# Patient Record
Sex: Male | Born: 1975 | Race: Black or African American | Hispanic: No | Marital: Married | Smoking: Current every day smoker
Health system: Southern US, Community
[De-identification: ages and names within clinical notes are randomized; demographics above are authoritative.]

## PROBLEM LIST (undated history)

## (undated) DIAGNOSIS — E7800 Pure hypercholesterolemia, unspecified: Secondary | ICD-10-CM

## (undated) DIAGNOSIS — I639 Cerebral infarction, unspecified: Secondary | ICD-10-CM

## (undated) DIAGNOSIS — K5792 Diverticulitis of intestine, part unspecified, without perforation or abscess without bleeding: Secondary | ICD-10-CM

## (undated) DIAGNOSIS — E119 Type 2 diabetes mellitus without complications: Secondary | ICD-10-CM

## (undated) DIAGNOSIS — I10 Essential (primary) hypertension: Secondary | ICD-10-CM

---

## 2020-07-20 ENCOUNTER — Emergency Department (HOSPITAL_COMMUNITY)
Admission: EM | Admit: 2020-07-20 | Discharge: 2020-07-21 | Disposition: A | Discharge status: Home / Self Care | Payer: Self-pay | Attending: Emergency Medicine | Admitting: Emergency Medicine

## 2020-07-20 ENCOUNTER — Encounter (HOSPITAL_COMMUNITY): Payer: Self-pay | Admitting: Emergency Medicine

## 2020-07-20 ENCOUNTER — Other Ambulatory Visit: Payer: Self-pay

## 2020-07-20 ENCOUNTER — Emergency Department (HOSPITAL_COMMUNITY): Payer: Self-pay

## 2020-07-20 DIAGNOSIS — I10 Essential (primary) hypertension: Secondary | ICD-10-CM | POA: Insufficient documentation

## 2020-07-20 DIAGNOSIS — E1165 Type 2 diabetes mellitus with hyperglycemia: Secondary | ICD-10-CM | POA: Insufficient documentation

## 2020-07-20 DIAGNOSIS — M79651 Pain in right thigh: Secondary | ICD-10-CM | POA: Insufficient documentation

## 2020-07-20 DIAGNOSIS — M62838 Other muscle spasm: Secondary | ICD-10-CM | POA: Insufficient documentation

## 2020-07-20 DIAGNOSIS — R739 Hyperglycemia, unspecified: Secondary | ICD-10-CM

## 2020-07-20 DIAGNOSIS — F17210 Nicotine dependence, cigarettes, uncomplicated: Secondary | ICD-10-CM | POA: Insufficient documentation

## 2020-07-20 HISTORY — DX: Cerebral infarction, unspecified: I63.9

## 2020-07-20 HISTORY — DX: Type 2 diabetes mellitus without complications: E11.9

## 2020-07-20 HISTORY — DX: Diverticulitis of intestine, part unspecified, without perforation or abscess without bleeding: K57.92

## 2020-07-20 HISTORY — DX: Pure hypercholesterolemia, unspecified: E78.00

## 2020-07-20 HISTORY — DX: Essential (primary) hypertension: I10

## 2020-07-20 LAB — CBC
HCT: 39.2 % (ref 39.0–52.0)
Hemoglobin: 13.3 g/dL (ref 13.0–17.0)
MCH: 29.8 pg (ref 26.0–34.0)
MCHC: 33.9 g/dL (ref 30.0–36.0)
MCV: 87.7 fL (ref 80.0–100.0)
Platelets: 334 10*3/uL (ref 150–400)
RBC: 4.47 MIL/uL (ref 4.22–5.81)
RDW: 13.2 % (ref 11.5–15.5)
WBC: 9.5 10*3/uL (ref 4.0–10.5)
nRBC: 0 % (ref 0.0–0.2)

## 2020-07-20 LAB — BASIC METABOLIC PANEL
Anion gap: 14 (ref 5–15)
BUN: 8 mg/dL (ref 6–20)
CO2: 21 mmol/L — ABNORMAL LOW (ref 22–32)
Calcium: 8.7 mg/dL — ABNORMAL LOW (ref 8.9–10.3)
Chloride: 94 mmol/L — ABNORMAL LOW (ref 98–111)
Creatinine, Ser: 0.87 mg/dL (ref 0.61–1.24)
GFR calc Af Amer: >60 mL/min (ref >60)
GFR calc non Af Amer: >60 mL/min (ref >60)
Glucose, Bld: 692 mg/dL (ref 70–99)
Potassium: 3.8 mmol/L (ref 3.5–5.1)
Sodium: 129 mmol/L — ABNORMAL LOW (ref 135–145)

## 2020-07-20 LAB — GLUCOSE, CAPILLARY: Glucose-Capillary: >600 mg/dL (ref 70–99)

## 2020-07-20 LAB — TROPONIN I (HIGH SENSITIVITY): Troponin I (High Sensitivity): 7 ng/L (ref <18)

## 2020-07-20 MED ORDER — SODIUM CHLORIDE 0.9 % IV BOLUS
500.0000 mL | Freq: Once | INTRAVENOUS | Status: AC
  Administered 2020-07-20: 500 mL via INTRAVENOUS

## 2020-07-20 MED ORDER — INSULIN ASPART 100 UNIT/ML IV SOLN
10.0000 [IU] | Freq: Once | INTRAVENOUS | Status: AC
  Administered 2020-07-20: 10 [IU] via INTRAVENOUS
  Filled 2020-07-20: qty 0.1

## 2020-07-20 MED ORDER — DIAZEPAM 5 MG/ML IJ SOLN
5.0000 mg | Freq: Once | INTRAMUSCULAR | Status: AC
  Administered 2020-07-20: 5 mg via INTRAVENOUS
  Filled 2020-07-20: qty 2

## 2020-07-20 NOTE — ED Notes (Signed)
Date and time results received: 07/20/20 10:57 PM  Test: Glucose Critical Value: 692

## 2020-07-20 NOTE — ED Triage Notes (Addendum)
Patient is complaining of right chest pain that is radiating to right upper thigh. Patient states this been going for 8 months but got bad yesterday. Patient CBG reading hi. Patient has a hx of diabetic type II.

## 2020-07-20 NOTE — ED Provider Notes (Signed)
Emergency Department Provider Note   I have reviewed the triage vital signs and the nursing notes.   HISTORY  Chief Complaint Chest Pain and Hyperglycemia   HPI Samuel Doyle is a 44 y.o. male with PMH reviewed below presents to the ED with elevated CBG and chronic right thigh pain.  Patient states that the pain in the right thigh has been going on for many months.  The symptoms radiate from his right chest to the right upper thigh.  No clear modifying factors.  Symptoms are unchanged over the past 8 months with no clear provoking factors.  Patient states that at home his blood sugars have been reading high.  He was recently taken off of his sliding scale insulin due to hypoglycemia.  He has been compliant with his Lantus.  He does not follow regular with a primary care doctor and seems that most of his medication adjustments occur either during admissions to the hospital or in the emergency department. Denies nausea or vomiting.   Past Medical History:  Diagnosis Date   Diabetes mellitus without complication (HCC)    Diverticulitis    High cholesterol    Hypertension    Stroke (HCC)     There are no problems to display for this patient.   History reviewed. No pertinent surgical history.  Allergies Lisinopril  History reviewed. No pertinent family history.  Social History Social History   Tobacco Use   Smoking status: Current Every Day Smoker    Types: Cigarettes   Smokeless tobacco: Never Used  Building services engineer Use: Never used  Substance Use Topics   Alcohol use: Yes   Drug use: Not Currently    Review of Systems  Constitutional: No fever/chills. Positive elevated CBG.  Eyes: No visual changes. ENT: No sore throat. Cardiovascular: Positive chest pain. Respiratory: Denies shortness of breath. Gastrointestinal: No abdominal pain.  No nausea, no vomiting.  No diarrhea.  No constipation. Genitourinary: Negative for  dysuria. Musculoskeletal: Negative for back pain. Positive right thigh pain.  Skin: Negative for rash. Neurological: Negative for headaches, focal weakness or numbness.  10-point ROS otherwise negative.  ____________________________________________   PHYSICAL EXAM:  VITAL SIGNS: ED Triage Vitals  Enc Vitals Group     BP 07/20/20 2201 117/81     Pulse Rate 07/20/20 2201 (!) 111     Resp 07/20/20 2201 (!) 22     Temp 07/20/20 2201 98.6 F (37 C)     Temp Source 07/20/20 2201 Oral     SpO2 07/20/20 2201 100 %     Weight 07/20/20 2148 200 lb (90.7 kg)     Height 07/20/20 2148 6\' 2"  (1.88 m)   Constitutional: Alert and oriented. Well appearing and in no acute distress. Eyes: Conjunctivae are normal. Head: Atraumatic. Nose: No congestion/rhinnorhea. Mouth/Throat: Mucous membranes are moist. Neck: No stridor.   Cardiovascular: Tachycardia. Good peripheral circulation. Grossly normal heart sounds.   Respiratory: Normal respiratory effort.  No retractions. Lungs CTAB. Gastrointestinal: Soft and nontender. No distention.  Musculoskeletal: No lower extremity tenderness nor edema. No gross deformities of extremities. Neurologic:  Normal speech and language. No gross focal neurologic deficits are appreciated.  Skin:  Skin is warm, dry and intact. No rash noted.  ____________________________________________   LABS (all labs ordered are listed, but only abnormal results are displayed)  Labs Reviewed  BASIC METABOLIC PANEL - Abnormal; Notable for the following components:      Result Value   Sodium 129 (*)  Chloride 94 (*)    CO2 21 (*)    Glucose, Bld 692 (*)    Calcium 8.7 (*)    All other components within normal limits  GLUCOSE, CAPILLARY - Abnormal; Notable for the following components:   Glucose-Capillary >600 (*)    All other components within normal limits  CBG MONITORING, ED - Abnormal; Notable for the following components:   Glucose-Capillary 403 (*)    All other  components within normal limits  CBC  CBG MONITORING, ED  TROPONIN I (HIGH SENSITIVITY)  TROPONIN I (HIGH SENSITIVITY)   ____________________________________________  EKG   EKG Interpretation  Date/Time:  Wednesday July 20 2020 22:36:59 EDT Ventricular Rate:  95 PR Interval:    QRS Duration: 102 QT Interval:  388 QTC Calculation: 488 R Axis:   42 Text Interpretation: Sinus rhythm Probable left atrial enlargement LVH with secondary repolarization abnormality Borderline prolonged QT interval No STEMI Confirmed by Alona Bene [phone]) on 07/20/2020 11:32:31 PM       ____________________________________________  RADIOLOGY  DG Chest 2 View  Result Date: 07/20/2020 CLINICAL DATA:  Chest pain EXAM: CHEST - 2 VIEW COMPARISON:  07/19/2020 FINDINGS: The heart size and mediastinal contours are within normal limits. Both lungs are clear. The visualized skeletal structures are unremarkable. IMPRESSION: No active cardiopulmonary disease. Electronically Signed   By: Charlett Nose M.D.   On: 07/20/2020 22:11    ____________________________________________   PROCEDURES  Procedure(s) performed:   Procedures  None  ____________________________________________   INITIAL IMPRESSION / ASSESSMENT AND PLAN / ED COURSE  Pertinent labs & imaging results that were available during my care of the patient were reviewed by me and considered in my medical decision making (see chart for details).   Patient presents to the emergency department very atypical spasm-like pain in the right chest radiating to the right thigh.  Symptoms seem spasm-like in nature.  His symptoms are improved with Valium.  I did discharge the patient home with the very small number of Valium tablets after review of the [place] drug database.  Patient cautioned to not drive while taking this medication and to not take it with alcohol or other pain medicines.  Patient's lab work here shows no evidence of DKA. Patient  plans to restart his sliding scale insulin and discussed with the primary care doctor.  Discussed ED return precautions in detail.  Blood sugar downtrending here in the emergency department with IV fluids and additional insulin.   ____________________________________________  FINAL CLINICAL IMPRESSION(S) / ED DIAGNOSES  Final diagnoses:  Hyperglycemia  Muscle spasm     MEDICATIONS GIVEN DURING THIS VISIT:  Medications  sodium chloride 0.9 % bolus 500 mL (0 mLs Intravenous Stopped 07/21/20 0054)  insulin aspart (novoLOG) injection 10 Units (10 Units Intravenous Given 07/20/20 2348)  diazepam (VALIUM) injection 5 mg (5 mg Intravenous Given 07/20/20 2346)     NEW OUTPATIENT MEDICATIONS STARTED DURING THIS VISIT:  Discharge Medication List as of 07/21/2020  1:12 AM    START taking these medications   Details  diazepam (VALIUM) 5 MG tablet Take 1 tablet (5 mg total) by mouth every 8 (eight) hours as needed for muscle spasms., Starting Thu 07/21/2020, Normal        Note:  This document was prepared using Dragon voice recognition software and may include unintentional dictation errors.  Alona Bene, MD, [facility] Emergency Medicine    Cherine Drumgoole, Arlyss Repress, MD 07/22/20 [phone]

## 2020-07-21 LAB — TROPONIN I (HIGH SENSITIVITY): Troponin I (High Sensitivity): 9 ng/L (ref <18)

## 2020-07-21 LAB — CBG MONITORING, ED: Glucose-Capillary: 403 mg/dL — ABNORMAL HIGH (ref 70–99)

## 2020-07-21 MED ORDER — DIAZEPAM 5 MG PO TABS
5.0000 mg | ORAL_TABLET | Freq: Three times a day (TID) | ORAL | 0 refills | Status: AC | PRN
Start: 2020-07-21 — End: ?

## 2020-07-21 NOTE — Discharge Instructions (Signed)
You were seen in the emergency department today with muscle spasms and elevated blood sugars.  Please discuss with your PCP regarding restarting your sliding scale insulin.  Please call the office first thing tomorrow to schedule your follow-up appointment.  I have discharged home with a very small number of Valium for muscle relaxation.  You cannot take this medicine with other pain medicines or alcohol.  He cannot drive a car while taking this medicine as it can impair your ability to do so.  Return to the emergency department any new or suddenly worsening symptoms.

## 2020-07-24 ENCOUNTER — Other Ambulatory Visit: Payer: Self-pay

## 2020-07-24 ENCOUNTER — Emergency Department (HOSPITAL_COMMUNITY)
Admission: EM | Admit: 2020-07-24 | Discharge: 2020-07-24 | Disposition: A | Discharge status: Home / Self Care | Payer: Self-pay | Attending: Emergency Medicine | Admitting: Emergency Medicine

## 2020-07-24 ENCOUNTER — Emergency Department (HOSPITAL_COMMUNITY): Payer: Self-pay

## 2020-07-24 DIAGNOSIS — F17210 Nicotine dependence, cigarettes, uncomplicated: Secondary | ICD-10-CM | POA: Insufficient documentation

## 2020-07-24 DIAGNOSIS — Z79899 Other long term (current) drug therapy: Secondary | ICD-10-CM | POA: Insufficient documentation

## 2020-07-24 DIAGNOSIS — R079 Chest pain, unspecified: Secondary | ICD-10-CM | POA: Insufficient documentation

## 2020-07-24 DIAGNOSIS — I10 Essential (primary) hypertension: Secondary | ICD-10-CM | POA: Insufficient documentation

## 2020-07-24 DIAGNOSIS — Z7984 Long term (current) use of oral hypoglycemic drugs: Secondary | ICD-10-CM | POA: Insufficient documentation

## 2020-07-24 DIAGNOSIS — Z7982 Long term (current) use of aspirin: Secondary | ICD-10-CM | POA: Insufficient documentation

## 2020-07-24 DIAGNOSIS — R519 Headache, unspecified: Secondary | ICD-10-CM | POA: Insufficient documentation

## 2020-07-24 DIAGNOSIS — E1165 Type 2 diabetes mellitus with hyperglycemia: Secondary | ICD-10-CM | POA: Insufficient documentation

## 2020-07-24 DIAGNOSIS — K6289 Other specified diseases of anus and rectum: Secondary | ICD-10-CM | POA: Insufficient documentation

## 2020-07-24 LAB — CBC
HCT: 39.9 % (ref 39.0–52.0)
Hemoglobin: 13.7 g/dL (ref 13.0–17.0)
MCH: 30.1 pg (ref 26.0–34.0)
MCHC: 34.3 g/dL (ref 30.0–36.0)
MCV: 87.7 fL (ref 80.0–100.0)
Platelets: 287 10*3/uL (ref 150–400)
RBC: 4.55 MIL/uL (ref 4.22–5.81)
RDW: 13.3 % (ref 11.5–15.5)
WBC: 10.4 10*3/uL (ref 4.0–10.5)
nRBC: 0 % (ref 0.0–0.2)

## 2020-07-24 LAB — BASIC METABOLIC PANEL
Anion gap: 11 (ref 5–15)
BUN: 9 mg/dL (ref 6–20)
CO2: 24 mmol/L (ref 22–32)
Calcium: 9 mg/dL (ref 8.9–10.3)
Chloride: 97 mmol/L — ABNORMAL LOW (ref 98–111)
Creatinine, Ser: 0.73 mg/dL (ref 0.61–1.24)
GFR calc Af Amer: >60 mL/min (ref >60)
GFR calc non Af Amer: >60 mL/min (ref >60)
Glucose, Bld: 699 mg/dL (ref 70–99)
Potassium: 4.4 mmol/L (ref 3.5–5.1)
Sodium: 132 mmol/L — ABNORMAL LOW (ref 135–145)

## 2020-07-24 LAB — CBG MONITORING, ED: Glucose-Capillary: >600 mg/dL (ref 70–99)

## 2020-07-24 LAB — TROPONIN I (HIGH SENSITIVITY)
Troponin I (High Sensitivity): 7 ng/L (ref <18)
Troponin I (High Sensitivity): 6 ng/L (ref <18)

## 2020-07-24 NOTE — ED Notes (Signed)
Patient has a blue top in the main lab °

## 2020-07-24 NOTE — ED Notes (Signed)
Pfeiffer MD at bedside 

## 2020-07-24 NOTE — ED Provider Notes (Signed)
[facility]-EMERGENCY DEPT Provider Note   CSN: [id number] Arrival date & time: 07/24/20  2423     History Chief Complaint  Patient presents with  . Chest Pain  . rectal spasm    Samuel Doyle is a 44 y.o. male.  HPI Patient has multiple complaints.  He reports he has chest pain that has been ongoing since January.  He reports he persistently has intermittent aching in his chest.  Not particularly worse with exertion.  Comes and goes and nothing makes it better.  No shortness of breath fever or productive cough.  Patient cardiac catheter 6\21\2021. Patient reports he also has problems with rectal pain.  He reports he gets spasms of pain.  It comes every few minutes.  He reports he has been given suppositories in the past but it does not help.  He reports sometimes is hard to have a bowel movement and then he will have hemorrhoids that also interfere.  Nothing he has been given or treated with is ever helped.  He states this is been ongoing since he had a colonoscopy in January.  He reports he had a second colonoscopy to try to solve the problem but it never got better.  He reports he is scheduled to get another colonoscopy in the next several months. Patient has been having headaches regularly for over 5 months.  No confusion, focal weakness numbness or incoordination.  Headaches aching and generalized in quality. When asked about his blood sugars, patient reports that he is typically running in the 200-300 range at baseline.  He reports he cannot afford his Lantus but he does take his sliding scale insulin.    Past Medical History:  Diagnosis Date  . Diabetes mellitus without complication (HCC)   . Diverticulitis   . High cholesterol   . Hypertension   . Stroke [facility])     There are no problems to display for this patient.   No past surgical history on file.     No family history on file.  Social History   Tobacco Use  . Smoking status:  Current Every Day Smoker    Types: Cigarettes  . Smokeless tobacco: Never Used  Vaping Use  . Vaping Use: Never used  Substance Use Topics  . Alcohol use: Yes  . Drug use: Not Currently    Home Medications Prior to Admission medications   Medication Sig Start Date End Date Taking? Authorizing Provider  aspirin 325 MG EC tablet Take 325 mg by mouth daily. 04/06/20   [provider]  atorvastatin (LIPITOR) 10 MG tablet Take 10 mg by mouth daily. 04/06/20   [provider]  diazepam (VALIUM) 5 MG tablet Take 1 tablet (5 mg total) by mouth every 8 (eight) hours as needed for muscle spasms. 07/21/20   Long, Arlyss Repress, MD  insulin glargine (LANTUS) 100 UNIT/ML injection Inject 80 Units into the skin daily.    [provider]  insulin regular (NOVOLIN R) 100 units/mL injection Inject 0-10 Units into the skin 3 (three) times daily before meals. Sliding scale    [provider]  metoprolol tartrate (LOPRESSOR) 25 MG tablet Take 25 mg by mouth 2 (two) times daily.  04/06/20   [provider]    Allergies    Lisinopril  Review of Systems   Review of Systems 10 systems reviewed and negative except as per HPI Physical Exam Updated Vital Signs BP (!) 126/101   Pulse 89   Temp  98.3 F (36.8 C) (Oral)   Resp 17   Ht 6\' 1"  (1.854 m)   Wt 90.7 kg   SpO2 99%   BMI 26.39 kg/m   Physical Exam Constitutional:      Comments: Patient is well-nourished well-developed.  He is clinically well in appearance.  Ambulatory without difficulty.  No appearance of acute distress.  HENT:     Head: Normocephalic and atraumatic.  Eyes:     Extraocular Movements: Extraocular movements intact.     Conjunctiva/sclera: Conjunctivae normal.  Cardiovascular:     Rate and Rhythm: Normal rate and regular rhythm.  Pulmonary:     Effort: Pulmonary effort is normal.     Breath sounds: Normal breath sounds.  Abdominal:     General: There is no distension.     Palpations:  Abdomen is soft.     Tenderness: There is no abdominal tenderness. There is no guarding.  Genitourinary:    Comments: Patient refuses rectal exam Musculoskeletal:        General: No swelling or tenderness. Normal range of motion.     Cervical back: Neck supple.     Right lower leg: No edema.     Left lower leg: No edema.  Skin:    General: Skin is warm and dry.  Neurological:     General: No focal deficit present.     Mental Status: He is oriented to person, place, and time.     Cranial Nerves: No cranial nerve deficit.     Coordination: Coordination normal.     Gait: Gait normal.     ED Results / Procedures / Treatments   Labs (all labs ordered are listed, but only abnormal results are displayed) Labs Reviewed  BASIC METABOLIC PANEL - Abnormal; Notable for the following components:      Result Value   Sodium 132 (*)    Chloride 97 (*)    Glucose, Bld 699 (*)    All other components within normal limits  CBG MONITORING, ED - Abnormal; Notable for the following components:   Glucose-Capillary >600 (*)    All other components within normal limits  CBC  TROPONIN I (HIGH SENSITIVITY)  TROPONIN I (HIGH SENSITIVITY)    EKG EKG Interpretation  Date/Time:  Sunday July 24 2020 09:10:05 EDT Ventricular Rate:  105 PR Interval:    QRS Duration: 97 QT Interval:  339 QTC Calculation: 448 R Axis:   21 Text Interpretation: Sinus tachycardia Multiple ventricular premature complexes Left ventricular hypertrophy no interval change from previous except PVC Confirmed by [id number] [phone]) on 07/24/2020 2:24:18 PM   Radiology DG Chest 2 View  Result Date: 07/24/2020 CLINICAL DATA:  Shortness of breath and chest pain.  Headaches. EXAM: CHEST - 2 VIEW COMPARISON:  06/12/2020, 07/20/2020 as well as chest CT 05/30/2020 and 07/11/2020 FINDINGS: Lungs are adequately inflated without focal lobar consolidation or effusion. Focal nodular opacity over the right midlung slightly less  prominent compared to the chest x-ray 06/12/2020 and noted to be slightly smaller on most recent CT compared to 05/30/2020. This likely resents resolving infectious/inflammatory process. Cardiomediastinal silhouette and remainder the exam is unchanged. IMPRESSION: 1.  No acute cardiopulmonary disease. 2. Focal nodular density over the right midlung with slight interval improvement. Likely resolving infectious/inflammatory process. Recommend follow-up chest radiograph 6-8 weeks. Electronically Signed   By: 06/01/2020 M.D.   On: 07/24/2020 11:04    Procedures Procedures (including critical care time)  Medications Ordered in ED Medications - No data  to display  ED Course  I have reviewed the triage vital signs and the nursing notes.  Pertinent labs & imaging results that were available during my care of the patient were reviewed by me and considered in my medical decision making (see chart for details).    MDM Rules/Calculators/A&P                         Patient presents as outlined above with multiple complaints.  He reports chest pain.  This appears to be quite longstanding and unlikely to be unstable angina.  Troponins are negative.  EKG does not show acute ischemic pattern present patient had cardiac catheterization in June with recommendations for medical management.  No acute recommendations regarding the patient's chest pain at this time. Patient reports rectal pain.  This also has been fairly longstanding.  Patient has had colonoscopies and is working with gastroenterology.  He describes spasms.  Patient had CT of the abdomen pelvis in July.  Abdomen is soft do not think that repeat imaging is indicated.  Patient refused to allow rectal exam at this time.  I do not have specific recommendations for management without performing rectal exam.  Patient does have follow-up with gastroenterology.  This is a likely stable condition but cannot definitively say there is no proctitis or other  significant finding.  Patient has significant hyperglycemia.  He does not show signs of being confused or encephalopathic.  He is not vomiting.  Patient is up and ambulatory and clinically well in appearance.  No acidosis or anion gap.  At this time consistent with poorly managed hyperglycemia.  Patient states he cannot afford Lantus and that he is using his sliding scale insulin.  Patient refused additional management of his hyperglycemia in the emergency department.  Patient has had significant medical care in terms of colonoscopies, catheterizations and hospitalizations.  It seems likely patient has had referrals to social work or other support for getting affordable medical care but I will add social work consult to try to coordinate affordable medications for patient. Patient reports he refuses any further evaluation or treatment.  He wants his IV out and he wants to leave.  He states "I know you are not going to do anything for me anyways".  Patient is being signed out AMA for refusal to allow for further management of hyperglycemia and rectal exam to definitively clear from perspective of possible rectal complications. Final Clinical Impression(s) / ED Diagnoses Final diagnoses:  Rectal pain  Uncontrolled type 2 diabetes mellitus with hyperglycemia (HCC)    Rx / DC Orders ED Discharge Orders    None       Arby Barrette, MD 07/24/20 1437

## 2020-07-24 NOTE — Discharge Instructions (Addendum)
1.  See your family doctor as soon as possible to discuss financial inability to afford your insulin.  Consultations been placed to the case manager at the hospital to see if you could be assisted to get your medications regularly. 2.  Follow-up with your gastroenterologist to continue working on management of rectal pain. 3.  Return to the emergency department if you determine you want ongoing care.

## 2020-07-24 NOTE — ED Notes (Signed)
Pt verbalized wish to leave. This RN made Pfeiffer Md aware.

## 2020-07-24 NOTE — ED Notes (Signed)
Date and time results received: 07/24/20 11:55 AM   Test: Glucose  Critical Value: 699  Name of Provider Notified: Triage   Orders Received? Or Actions Taken?:

## 2020-07-24 NOTE — ED Notes (Signed)
Pt left AMA. Unable to sign signature pad. IV removed.

## 2020-07-24 NOTE — ED Triage Notes (Signed)
Patient reports chest pain, rectal spasms and headaches starting 5 months ago. Patient states the pains come and go all day long. Chest pain 7/10, rectal spasm 10/10

## 2022-03-12 IMAGING — CR DG CHEST 2V
2 series · 2 of 2 positions shown · non-contrast
Comparison: 07/19/2020

CLINICAL DATA: Chest pain

EXAM:
CHEST - 2 VIEW

[w chest pa]
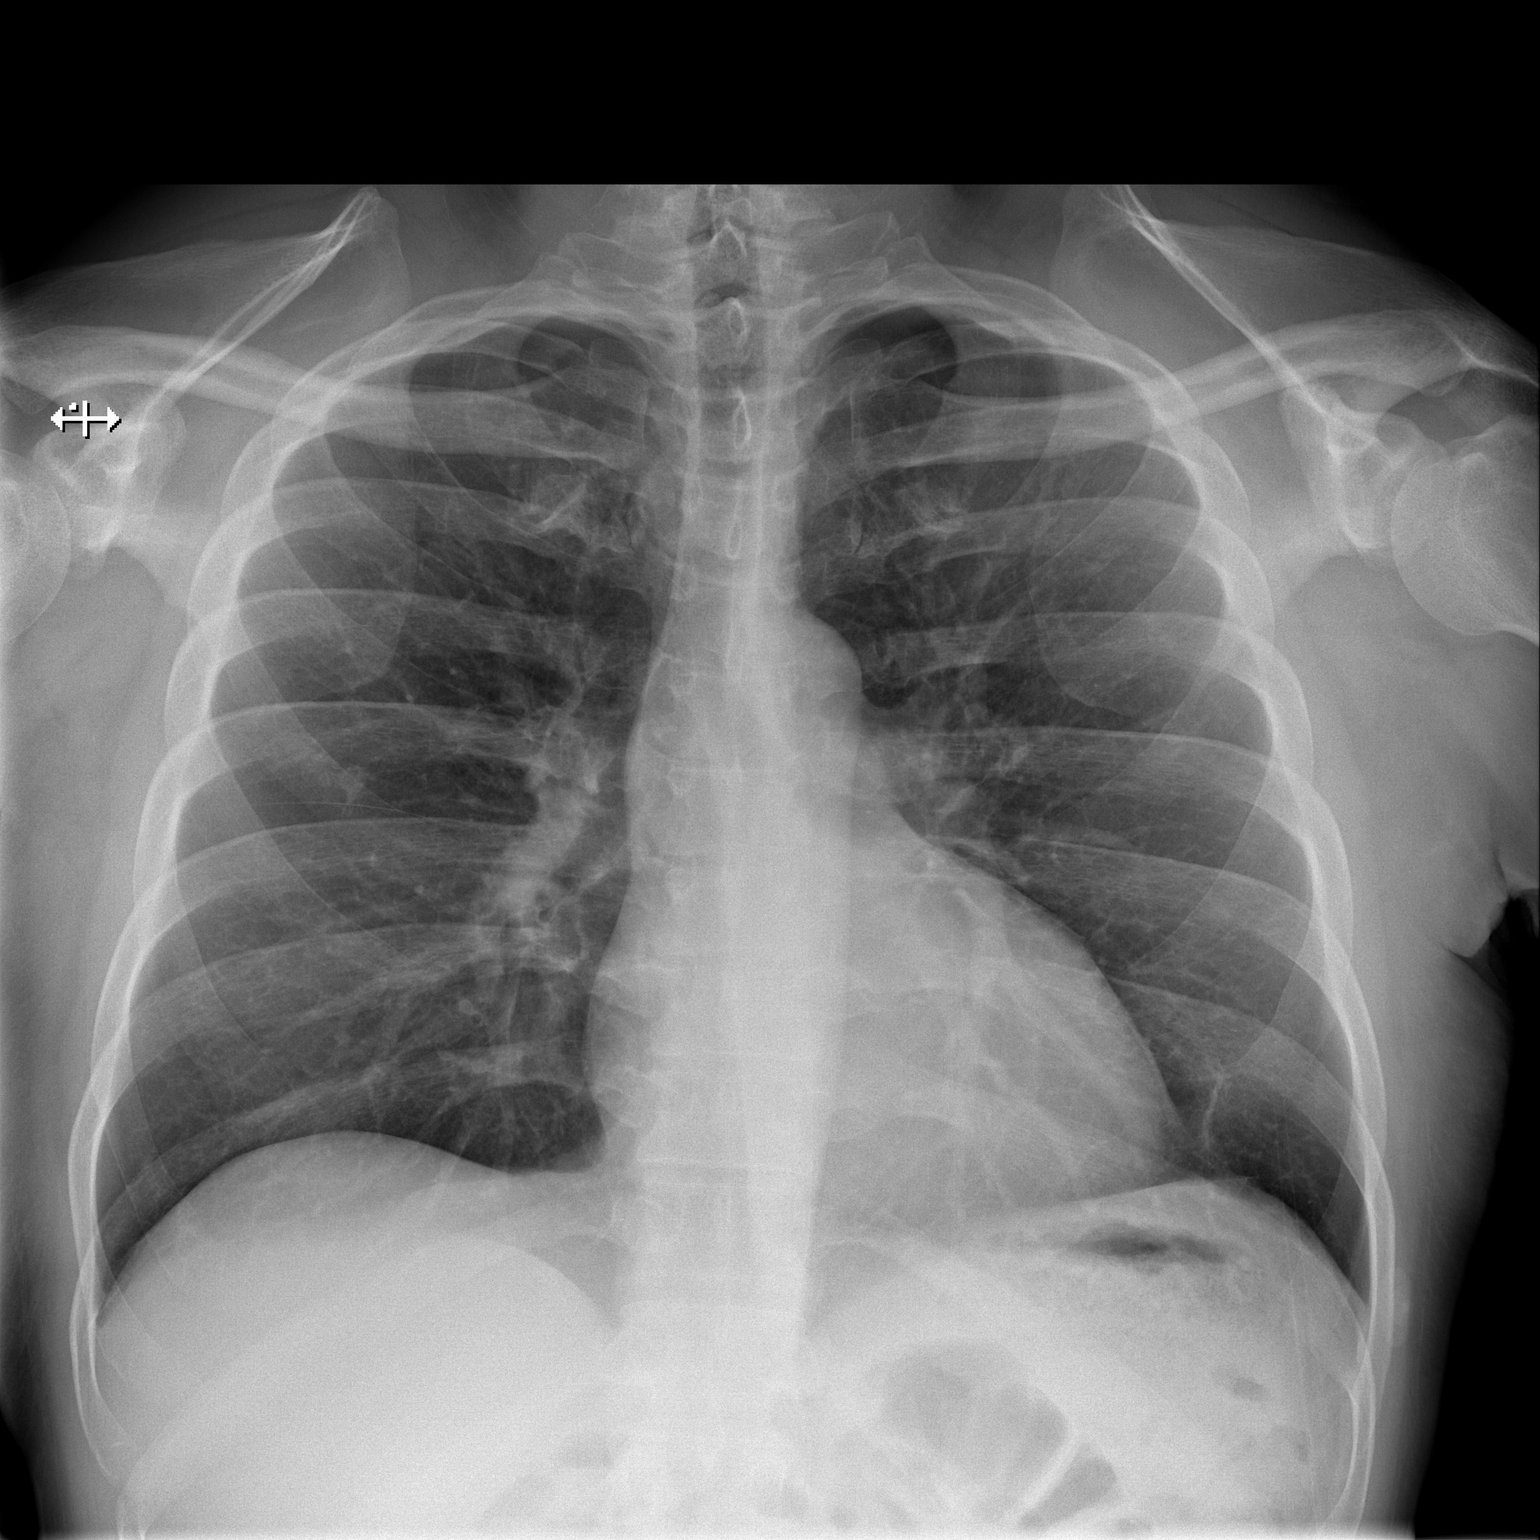

[w chest lat]
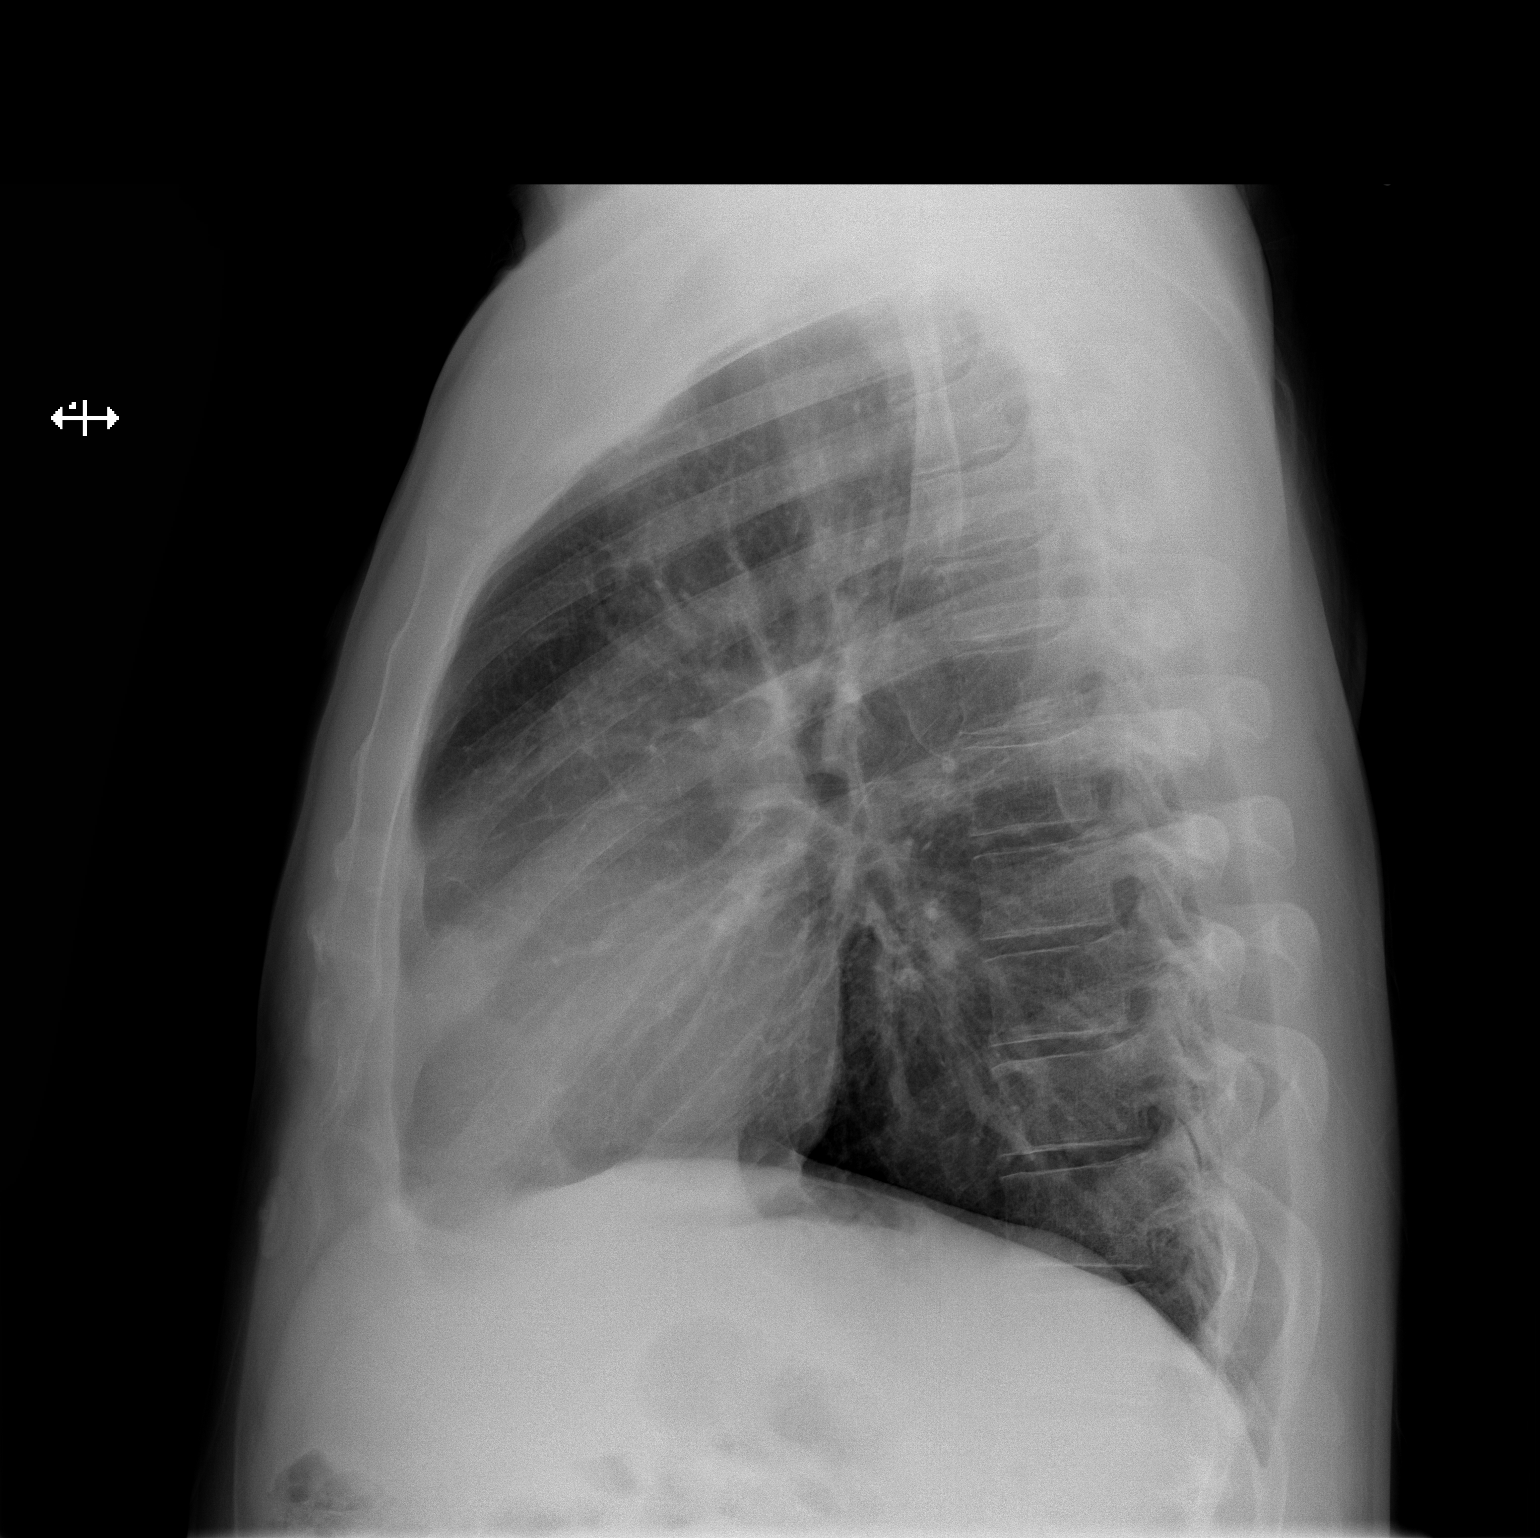

[2 of 2 positions shown; findings below may reference images not displayed]

FINDINGS: The heart size and mediastinal contours are within normal limits.
Both lungs are clear. The visualized skeletal structures are
unremarkable.
IMPRESSION: No active cardiopulmonary disease.
